# Patient Record
Sex: Female | Born: 1995 | Race: Black or African American | Hispanic: Yes | Marital: Single | Smoking: Never smoker
Health system: Southern US, Community
[De-identification: ages and names within clinical notes are randomized; demographics above are authoritative.]

---

## 2020-11-26 ENCOUNTER — Emergency Department (HOSPITAL_BASED_OUTPATIENT_CLINIC_OR_DEPARTMENT_OTHER)
Admission: EM | Admit: 2020-11-26 | Discharge: 2020-11-26 | Disposition: A | Discharge status: Home / Self Care | Payer: Self-pay | Attending: Emergency Medicine | Admitting: Emergency Medicine

## 2020-11-26 ENCOUNTER — Encounter (HOSPITAL_BASED_OUTPATIENT_CLINIC_OR_DEPARTMENT_OTHER): Payer: Self-pay

## 2020-11-26 ENCOUNTER — Emergency Department (HOSPITAL_BASED_OUTPATIENT_CLINIC_OR_DEPARTMENT_OTHER): Payer: Self-pay

## 2020-11-26 ENCOUNTER — Other Ambulatory Visit: Payer: Self-pay

## 2020-11-26 DIAGNOSIS — R102 Pelvic and perineal pain: Secondary | ICD-10-CM

## 2020-11-26 DIAGNOSIS — B9689 Other specified bacterial agents as the cause of diseases classified elsewhere: Secondary | ICD-10-CM | POA: Insufficient documentation

## 2020-11-26 DIAGNOSIS — N898 Other specified noninflammatory disorders of vagina: Secondary | ICD-10-CM

## 2020-11-26 DIAGNOSIS — N390 Urinary tract infection, site not specified: Secondary | ICD-10-CM | POA: Insufficient documentation

## 2020-11-26 DIAGNOSIS — N760 Acute vaginitis: Secondary | ICD-10-CM | POA: Insufficient documentation

## 2020-11-26 LAB — URINALYSIS, ROUTINE W REFLEX MICROSCOPIC
Glucose, UA: NEGATIVE mg/dL
Hgb urine dipstick: NEGATIVE
Ketones, ur: NEGATIVE mg/dL
Nitrite: POSITIVE — AB
Protein, ur: 30 mg/dL — AB
Specific Gravity, Urine: 1.033 — ABNORMAL HIGH (ref 1.005–1.030)
WBC, UA: >50 WBC/hpf — ABNORMAL HIGH (ref 0–5)
pH: 6.5 (ref 5.0–8.0)

## 2020-11-26 LAB — COMPREHENSIVE METABOLIC PANEL
ALT: 11 U/L (ref 0–44)
AST: 13 U/L — ABNORMAL LOW (ref 15–41)
Albumin: 4 g/dL (ref 3.5–5.0)
Alkaline Phosphatase: 41 U/L (ref 38–126)
Anion gap: 7 (ref 5–15)
BUN: 16 mg/dL (ref 6–20)
CO2: 26 mmol/L (ref 22–32)
Calcium: 9.2 mg/dL (ref 8.9–10.3)
Chloride: 104 mmol/L (ref 98–111)
Creatinine, Ser: 0.75 mg/dL (ref 0.44–1.00)
GFR, Estimated: >60 mL/min (ref >60)
Glucose, Bld: 82 mg/dL (ref 70–99)
Potassium: 4.1 mmol/L (ref 3.5–5.1)
Sodium: 137 mmol/L (ref 135–145)
Total Bilirubin: 0.2 mg/dL — ABNORMAL LOW (ref 0.3–1.2)
Total Protein: 7.7 g/dL (ref 6.5–8.1)

## 2020-11-26 LAB — CBC WITH DIFFERENTIAL/PLATELET
Abs Immature Granulocytes: 0.01 10*3/uL (ref 0.00–0.07)
Basophils Absolute: 0.1 10*3/uL (ref 0.0–0.1)
Basophils Relative: 1 %
Eosinophils Absolute: 0.1 10*3/uL (ref 0.0–0.5)
Eosinophils Relative: 1 %
HCT: 32.1 % — ABNORMAL LOW (ref 36.0–46.0)
Hemoglobin: 9.9 g/dL — ABNORMAL LOW (ref 12.0–15.0)
Immature Granulocytes: 0 %
Lymphocytes Relative: 34 %
Lymphs Abs: 2.1 10*3/uL (ref 0.7–4.0)
MCH: 25.9 pg — ABNORMAL LOW (ref 26.0–34.0)
MCHC: 30.8 g/dL (ref 30.0–36.0)
MCV: 84 fL (ref 80.0–100.0)
Monocytes Absolute: 0.7 10*3/uL (ref 0.1–1.0)
Monocytes Relative: 11 %
Neutro Abs: 3.2 10*3/uL (ref 1.7–7.7)
Neutrophils Relative %: 53 %
Platelets: 502 10*3/uL — ABNORMAL HIGH (ref 150–400)
RBC: 3.82 MIL/uL — ABNORMAL LOW (ref 3.87–5.11)
RDW: 14.8 % (ref 11.5–15.5)
WBC: 6.2 10*3/uL (ref 4.0–10.5)
nRBC: 0 % (ref 0.0–0.2)

## 2020-11-26 LAB — PREGNANCY, URINE: Preg Test, Ur: NEGATIVE

## 2020-11-26 LAB — WET PREP, GENITAL
Sperm: NONE SEEN
Trich, Wet Prep: NONE SEEN
Yeast Wet Prep HPF POC: NONE SEEN

## 2020-11-26 MED ORDER — DEXTROSE 5 % IV SOLN: 500.0000 mg | Freq: Once | INTRAVENOUS | Status: DC

## 2020-11-26 MED ORDER — DOXYCYCLINE HYCLATE 100 MG PO CAPS: 100.0000 mg | ORAL_CAPSULE | Freq: Two times a day (BID) | ORAL | 0 refills | Status: AC

## 2020-11-26 MED ORDER — CEPHALEXIN 500 MG PO CAPS: 500.0000 mg | ORAL_CAPSULE | Freq: Four times a day (QID) | ORAL | 0 refills | Status: AC

## 2020-11-26 MED ORDER — SODIUM CHLORIDE 0.9 % IV SOLN
1.0000 g | Freq: Once | INTRAVENOUS | Status: AC
  Administered 2020-11-26: 1 g via INTRAVENOUS
  Filled 2020-11-26: qty 10

## 2020-11-26 MED ORDER — METRONIDAZOLE 500 MG PO TABS: 500.0000 mg | ORAL_TABLET | Freq: Two times a day (BID) | ORAL | 0 refills | Status: DC

## 2020-11-26 NOTE — ED Triage Notes (Signed)
Onset 11/10/20 cramping and frequency.  States has continued pain bilateral in sides.  No nausea or vomiting

## 2020-11-26 NOTE — Discharge Instructions (Addendum)
You tested positive for bacterial vaginosis.  You will need to take metronidazole.  While taking this medicine, you should not consume any alcohol as this can cause severe side effects with metronidazole.  Your urinalysis also was concerning for a urinary tract infection.  Please take the cephalexin as prescribed for a UTI.  Your gonorrhea and Chlamydia testing will hopefully come back in 24 to 48 hours.  Please check MyChart for results.  If positive, both you and your partner will need to complete treatment and refrain from any sexual activity for at least 1 week after treatment is complete.  Recommend completing a course of doxycycline for treatment of possible STDs.  If you develop worsening abdominal pain, vomiting, fever, come back to ER for reassessment.

## 2020-11-26 NOTE — ED Provider Notes (Signed)
[facility] EMERGENCY DEPT Provider Note   CSN: [id number] Arrival date & time: 11/26/20  1519     History Chief Complaint  Patient presents with  . Urinary Frequency    Urinary frequency with bladder cranping.    Amanda Charles is a 25 y.o. female.  Presents to ER with concern for lower abdominal cramping.  States that she had pain starting around the time of her period a couple weeks ago.  Felt to be similar to her normal menstrual cycle pain.  However the pain persisted despite completing her menstrual cycle.  Normal amount of bleeding.  No ongoing vaginal bleeding.  Has noted new yellow discharge recently.  She is sexually active with 1 female partner, does use protection.  Unsure of any known STD exposures.  Pain is currently mild to moderate, crampy, across lower abdomen.  No alleviating or aggravating factors. HPI     History reviewed. No pertinent past medical history.  There are no problems to display for this patient.   History reviewed. No pertinent surgical history.   OB History   No obstetric history on file.     History reviewed. No pertinent family history.  Social History   Tobacco Use  . Smoking status: Never Smoker  . Smokeless tobacco: Never Used  Vaping Use  . Vaping Use: Never used  Substance Use Topics  . Alcohol use: Yes  . Drug use: Yes    Types: Marijuana    Home Medications Prior to Admission medications   Medication Sig Start Date End Date Taking? Authorizing Provider  cephALEXin (KEFLEX) 500 MG capsule Take 1 capsule (500 mg total) by mouth 4 (four) times daily for 5 days. 11/26/20 12/01/20 Yes Sanjuan Sawa, Quitman Livings, MD  doxycycline (VIBRAMYCIN) 100 MG capsule Take 1 capsule (100 mg total) by mouth 2 (two) times daily for 7 days. 11/26/20 12/03/20 Yes Hakan Nudelman, Quitman Livings, MD  metroNIDAZOLE (FLAGYL) 500 MG tablet Take 1 tablet (500 mg total) by mouth 2 (two) times daily. 11/26/20  Yes Milagros Loll, MD    Allergies    Patient has no  allergy information on record.  Review of Systems   Review of Systems  Constitutional: Negative for chills and fever.  HENT: Negative for ear pain and sore throat.   Eyes: Negative for pain and visual disturbance.  Respiratory: Negative for cough and shortness of breath.   Cardiovascular: Negative for chest pain and palpitations.  Gastrointestinal: Positive for abdominal pain. Negative for vomiting.  Genitourinary: Positive for pelvic pain. Negative for dysuria and hematuria.  Musculoskeletal: Negative for arthralgias and back pain.  Skin: Negative for color change and rash.  Neurological: Negative for seizures and syncope.  All other systems reviewed and are negative.   Physical Exam Updated Vital Signs BP 117/69   Pulse 68   Temp 98.6 F (37 C) (Oral)   Resp 18   Ht 5\' 2"  (1.575 m)   Wt 117.9 kg   LMP 11/13/2020   SpO2 98%   BMI 47.55 kg/m   Physical Exam Vitals and nursing note reviewed.  Constitutional:      General: She is not in acute distress.    Appearance: She is well-developed.  HENT:     Head: Normocephalic and atraumatic.  Eyes:     Conjunctiva/sclera: Conjunctivae normal.  Cardiovascular:     Rate and Rhythm: Normal rate and regular rhythm.     Heart sounds: No murmur heard.   Pulmonary:     Effort: Pulmonary effort  is normal. No respiratory distress.     Breath sounds: Normal breath sounds.  Abdominal:     Palpations: Abdomen is soft.     Tenderness: There is no abdominal tenderness.  Genitourinary:    Comments: Small yellow discharge noted in vagina, normal-appearing cervix, no CMT, no adnexal tenderness or mass appreciated, tech at bedside for chaperone Musculoskeletal:     Cervical back: Neck supple.  Skin:    General: Skin is warm and dry.  Neurological:     Mental Status: She is alert.     ED Results / Procedures / Treatments   Labs (all labs ordered are listed, but only abnormal results are displayed) Labs Reviewed  WET PREP,  GENITAL - Abnormal; Notable for the following components:      Result Value   Clue Cells Wet Prep HPF POC PRESENT (*)    WBC, Wet Prep HPF POC MANY (*)    All other components within normal limits  URINALYSIS, ROUTINE W REFLEX MICROSCOPIC - Abnormal; Notable for the following components:   Color, Urine ORANGE (*)    APPearance HAZY (*)    Specific Gravity, Urine 1.033 (*)    Bilirubin Urine MODERATE (*)    Protein, ur 30 (*)    Nitrite POSITIVE (*)    Leukocytes,Ua MODERATE (*)    WBC, UA >50 (*)    All other components within normal limits  CBC WITH DIFFERENTIAL/PLATELET - Abnormal; Notable for the following components:   RBC 3.82 (*)    Hemoglobin 9.9 (*)    HCT 32.1 (*)    MCH 25.9 (*)    Platelets 502 (*)    All other components within normal limits  COMPREHENSIVE METABOLIC PANEL - Abnormal; Notable for the following components:   AST 13 (*)    Total Bilirubin 0.2 (*)    All other components within normal limits  PREGNANCY, URINE  GC/CHLAMYDIA PROBE AMP ([place]) NOT AT [facility]  GC/CHLAMYDIA PROBE AMP ([place]) NOT AT [facility]    EKG None  Radiology US PELVIC COMPLETE W TRANSVAGINAL AND TORSION R/O  Result Date: 11/26/2020 CLINICAL DATA:  Bilateral pelvic cramping. EXAM: TRANSABDOMINAL AND TRANSVAGINAL ULTRASOUND OF PELVIS DOPPLER ULTRASOUND OF OVARIES TECHNIQUE: Both transabdominal and transvaginal ultrasound examinations of the pelvis were performed. Transabdominal technique was performed for global imaging of the pelvis including uterus, ovaries, adnexal regions, and pelvic cul-de-sac. It was necessary to proceed with endovaginal exam following the transabdominal exam to visualize the adnexa. Color and duplex Doppler ultrasound was utilized to evaluate blood flow to the ovaries. COMPARISON:  None. FINDINGS: Uterus Measurements: 7.0 x 3.0 x 4.3 cm = volume: 48 mL. No fibroids or other mass visualized. Endometrium Thickness: 7 mm.  No focal abnormality visualized. Right  ovary Measurements: 4.0 x 1.9 x 3.1 cm = volume: 11.8 mL. Normal appearance/no adnexal mass. Left ovary Measurements: 3.3 x 2.8 x 2.8 cm = volume: 13.6 mL. Normal appearance/no adnexal mass. Pulsed Doppler evaluation of both ovaries demonstrates normal low-resistance arterial and venous waveforms. Simple cyst on the left ovary measures 2.4 cm, likely physiologic. Other findings Small to moderate amount of free fluid in the pelvis. IMPRESSION: Normal pelvic ultrasound. Electronically Signed   By: Ted Mcalpine M.D.   On: 11/26/2020 16:59    Procedures Procedures   Medications Ordered in ED Medications  cefTRIAXone (ROCEPHIN) 1 g in sodium chloride 0.9 % 100 mL IVPB (1 g Intravenous New Bag/Given 11/26/20 1844)    ED Course  I have reviewed  the triage vital signs and the nursing notes.  Pertinent labs & imaging results that were available during my care of the patient were reviewed by me and considered in my medical decision making (see chart for details).    MDM Rules/Calculators/A&P                         25 year old lady presented to ER with concern for lower abdominal pain/pelvic cramping for the past few weeks.  On exam she was well-appearing, mild tenderness in the suprapubic region but otherwise soft and benign abdominal exam.  GU exam notable for yellow discharge.  UA consistent with UTI.  Also BV positive.  Basic labs are stable.  No leukocytosis.  No focal tenderness at McBurney's point, doubt acute abdominal process given benign exam and reassuring labs and chronicity of pain.  TVUS negative for torsion or TOA.  Given h color of discharge and sexually active, will treat presumptively for possible GC and chlamydia.  On reassessment, remains well-appearing with soft abdomen.  Will be discharged home with Rx for Keflex for UTI, Flagyl for BV and Doxy for chlamydia.  Received Rocephin in ER.  Reviewed return precautions and recommended follow-up with primary doctor.  After the  discussed management above, the patient was determined to be safe for discharge.  The patient was in agreement with this plan and all questions regarding their care were answered.  ED return precautions were discussed and the patient will return to the ED with any significant worsening of condition.    Final Clinical Impression(s) / ED Diagnoses Final diagnoses:  Urinary tract infection without hematuria, site unspecified  Bacterial vaginosis  Vaginal discharge    Rx / DC Orders ED Discharge Orders         Ordered    metroNIDAZOLE (FLAGYL) 500 MG tablet  2 times daily        11/26/20 1805    doxycycline (VIBRAMYCIN) 100 MG capsule  2 times daily        11/26/20 1805    cephALEXin (KEFLEX) 500 MG capsule  4 times daily        11/26/20 1805           Milagros Loll, MD 11/27/20 1640

## 2020-11-26 NOTE — ED Notes (Signed)
Patient transported to Ultrasound 

## 2020-11-28 LAB — GC/CHLAMYDIA PROBE AMP (~~LOC~~) NOT AT ARMC
Chlamydia: POSITIVE — AB
Comment: NEGATIVE
Comment: NORMAL
Neisseria Gonorrhea: NEGATIVE

## 2020-12-17 ENCOUNTER — Encounter (HOSPITAL_BASED_OUTPATIENT_CLINIC_OR_DEPARTMENT_OTHER): Payer: Self-pay

## 2020-12-17 ENCOUNTER — Other Ambulatory Visit: Payer: Self-pay

## 2020-12-17 ENCOUNTER — Emergency Department (HOSPITAL_BASED_OUTPATIENT_CLINIC_OR_DEPARTMENT_OTHER)
Admission: EM | Admit: 2020-12-17 | Discharge: 2020-12-18 | Disposition: A | Discharge status: Home / Self Care | Payer: Medicaid Other | Attending: Emergency Medicine | Admitting: Emergency Medicine

## 2020-12-17 DIAGNOSIS — N898 Other specified noninflammatory disorders of vagina: Secondary | ICD-10-CM | POA: Insufficient documentation

## 2020-12-17 NOTE — ED Triage Notes (Signed)
Pt was here  3 wks ago  and was dx with chlamydia - states she finished her abx 12/04/2020 but still she noticed vaginal discharge  Denies abd pain /  fever

## 2020-12-17 NOTE — ED Provider Notes (Signed)
[facility] EMERGENCY DEPT Provider Note   CSN: [id number] Arrival date & time: 12/17/20  2054     History No chief complaint on file.   Amanda Charles is a 25 y.o. female.  HPI    This is a 25 year old female who presents with concerns for vaginal discharge.  Patient reports that she was seen and evaluated 3 weeks ago for the same.  At that time she was diagnosed with urinary tract infection and ultimately was chlamydia positive.  She was also treated for BV.  She states she took a full course of antibiotics.  Per chart review it appears that she was sent home on Keflex, doxycycline, and Flagyl.  She has not had any sexual encounters since that visit.  She states that she just had a menstrual period and after that ended, she noted that she is continued to have some vaginal discharge.  No abdominal pain, no vaginal itching.  Denies dysuria. History reviewed. No pertinent past medical history.  There are no problems to display for this patient.   History reviewed. No pertinent surgical history.   OB History   No obstetric history on file.     History reviewed. No pertinent family history.  Social History   Tobacco Use   Smoking status: Never   Smokeless tobacco: Never  Vaping Use   Vaping Use: Never used  Substance Use Topics   Alcohol use: Yes   Drug use: Yes    Types: Marijuana    Home Medications Prior to Admission medications   Medication Sig Start Date End Date Taking? Authorizing Provider  metroNIDAZOLE (FLAGYL) 500 MG tablet Take 1 tablet (500 mg total) by mouth 2 (two) times daily. 11/26/20   Milagros Loll, MD    Allergies    Patient has no allergy information on record.  Review of Systems   Review of Systems  Constitutional:  Negative for fever.  Gastrointestinal:  Negative for abdominal pain.  Genitourinary:  Positive for vaginal discharge. Negative for difficulty urinating, dysuria and frequency.  All other systems reviewed and are  negative.  Physical Exam Updated Vital Signs BP (!) 147/91   Pulse 77   Temp 98.2 F (36.8 C) (Oral)   Resp 18   Ht 1.575 m (5\' 2" )   Wt 117 kg   LMP 12/12/2020   SpO2 100%   BMI 47.18 kg/m   Physical Exam Vitals and nursing note reviewed.  Constitutional:      Appearance: She is well-developed. She is obese.  HENT:     Head: Normocephalic and atraumatic.     Mouth/Throat:     Mouth: Mucous membranes are moist.  Eyes:     Pupils: Pupils are equal, round, and reactive to light.  Cardiovascular:     Rate and Rhythm: Normal rate and regular rhythm.  Pulmonary:     Effort: Pulmonary effort is normal. No respiratory distress.  Abdominal:     Palpations: Abdomen is soft.     Tenderness: There is no abdominal tenderness.  Genitourinary:    Comments: pelvic exam deferred, patient self swabbed Musculoskeletal:     Cervical back: Neck supple.  Skin:    General: Skin is warm and dry.  Neurological:     Mental Status: She is alert and oriented to person, place, and time.  Psychiatric:        Mood and Affect: Mood normal.    ED Results / Procedures / Treatments   Labs (all labs ordered are listed, but  only abnormal results are displayed) Labs Reviewed  URINALYSIS, ROUTINE W REFLEX MICROSCOPIC - Abnormal; Notable for the following components:      Result Value   Specific Gravity, Urine 1.036 (*)    Ketones, ur TRACE (*)    Protein, ur 30 (*)    All other components within normal limits  WET PREP, GENITAL  PREGNANCY, URINE  GC/CHLAMYDIA PROBE AMP ([place]) NOT AT [facility]    EKG None  Radiology No results found.  Procedures Procedures   Medications Ordered in ED Medications - No data to display  ED Course  I have reviewed the triage vital signs and the nursing notes.  Pertinent labs & imaging results that were available during my care of the patient were reviewed by me and considered in my medical decision making (see chart for details).    MDM  Rules/Calculators/A&P                          Patient presents with ongoing vaginal discharge.  Nontoxic and vital signs notable for blood pressure of 147/91.  Denies any sexual contact since testing positive for chlamydia.  She reports full course of treatment.  Patient deferred pelvic exam today she is not having any abdominal pain.  She self swabbed.  Wet prep does not show any evidence of clue cells.  Urinalysis appears clean and no residual UTI.  Repeat GC testing pending.  Discussed with patient that her initial work-up is fairly reassuring.  She will receive a phone call if she test positive again and will have appropriate antibiotics called in.  I discussed with patient that some vaginal discharge can be normal as well and will fluctuate during her menstrual cycle.  Patient stated understanding.  After history, exam, and medical workup I feel the patient has been appropriately medically screened and is safe for discharge home. Pertinent diagnoses were discussed with the patient. Patient was given return precautions.    Final Clinical Impression(s) / ED Diagnoses Final diagnoses:  Vaginal discharge    Rx / DC Orders ED Discharge Orders     None        Ramir Malerba, Mayer Masker, MD 12/18/20 [phone]

## 2020-12-18 LAB — URINALYSIS, ROUTINE W REFLEX MICROSCOPIC
Bilirubin Urine: NEGATIVE
Glucose, UA: NEGATIVE mg/dL
Hgb urine dipstick: NEGATIVE
Leukocytes,Ua: NEGATIVE
Nitrite: NEGATIVE
Protein, ur: 30 mg/dL — AB
Specific Gravity, Urine: 1.036 — ABNORMAL HIGH (ref 1.005–1.030)
pH: 7 (ref 5.0–8.0)

## 2020-12-18 LAB — PREGNANCY, URINE: Preg Test, Ur: NEGATIVE

## 2020-12-18 LAB — WET PREP, GENITAL
Clue Cells Wet Prep HPF POC: NONE SEEN
Sperm: NONE SEEN
Trich, Wet Prep: NONE SEEN
WBC, Wet Prep HPF POC: NONE SEEN
Yeast Wet Prep HPF POC: NONE SEEN

## 2020-12-18 NOTE — ED Notes (Signed)
Assisted patient with wet prep swabs.

## 2020-12-18 NOTE — Discharge Instructions (Addendum)
Your repeat testing shows no evidence of UTI or BV.  Repeat chlamydia testing is pending.  Abstain from sexual activity until your repeat testing returns.

## 2020-12-19 LAB — GC/CHLAMYDIA PROBE AMP (~~LOC~~) NOT AT ARMC
Chlamydia: NEGATIVE
Comment: NEGATIVE
Comment: NORMAL
Neisseria Gonorrhea: NEGATIVE

## 2020-12-21 ENCOUNTER — Ambulatory Visit (HOSPITAL_BASED_OUTPATIENT_CLINIC_OR_DEPARTMENT_OTHER): Payer: Medicaid Other | Admitting: Nurse Practitioner

## 2020-12-21 ENCOUNTER — Ambulatory Visit (HOSPITAL_BASED_OUTPATIENT_CLINIC_OR_DEPARTMENT_OTHER): Payer: Medicaid Other | Admitting: Family Medicine

## 2021-06-01 ENCOUNTER — Other Ambulatory Visit: Payer: Self-pay

## 2021-06-01 ENCOUNTER — Encounter (HOSPITAL_BASED_OUTPATIENT_CLINIC_OR_DEPARTMENT_OTHER): Payer: Self-pay | Admitting: Emergency Medicine

## 2021-06-01 DIAGNOSIS — R102 Pelvic and perineal pain: Secondary | ICD-10-CM | POA: Insufficient documentation

## 2021-06-01 DIAGNOSIS — N898 Other specified noninflammatory disorders of vagina: Secondary | ICD-10-CM | POA: Insufficient documentation

## 2021-06-01 LAB — PREGNANCY, URINE: Preg Test, Ur: NEGATIVE

## 2021-06-01 LAB — URINALYSIS, ROUTINE W REFLEX MICROSCOPIC
Glucose, UA: NEGATIVE mg/dL
Hgb urine dipstick: NEGATIVE
Ketones, ur: NEGATIVE mg/dL
Leukocytes,Ua: NEGATIVE
Nitrite: POSITIVE — AB
Protein, ur: NEGATIVE mg/dL
Specific Gravity, Urine: 1.025 (ref 1.005–1.030)
pH: 6.5 (ref 5.0–8.0)

## 2021-06-01 NOTE — ED Triage Notes (Signed)
Pt presents to ED POV. Pt c/o vaginal discharge and pelvic pain. Pt reports that she was treated for chlamydia, BV, and UTI in July. Pt reports that she never really felt the same after that but s/s are worsening again.

## 2021-06-02 ENCOUNTER — Emergency Department (HOSPITAL_BASED_OUTPATIENT_CLINIC_OR_DEPARTMENT_OTHER): Payer: Medicaid Other

## 2021-06-02 ENCOUNTER — Emergency Department (HOSPITAL_BASED_OUTPATIENT_CLINIC_OR_DEPARTMENT_OTHER)
Admission: EM | Admit: 2021-06-02 | Discharge: 2021-06-02 | Disposition: A | Discharge status: Home / Self Care | Payer: Medicaid Other | Attending: Emergency Medicine | Admitting: Emergency Medicine

## 2021-06-02 DIAGNOSIS — R102 Pelvic and perineal pain: Secondary | ICD-10-CM

## 2021-06-02 DIAGNOSIS — N898 Other specified noninflammatory disorders of vagina: Secondary | ICD-10-CM

## 2021-06-02 LAB — WET PREP, GENITAL
Clue Cells Wet Prep HPF POC: NONE SEEN
Sperm: NONE SEEN
Trich, Wet Prep: NONE SEEN
WBC, Wet Prep HPF POC: <10 (ref <10)
Yeast Wet Prep HPF POC: NONE SEEN

## 2021-06-02 NOTE — ED Provider Notes (Signed)
[facility] EMERGENCY Provider Note: Lowella Dell, MD, FACEP  CSN: [id number] MRN: [id number] ARRIVAL: 06/01/21 at 2006 ROOM: DB016/DB016   CHIEF COMPLAINT  Vaginal Discharge   HISTORY OF PRESENT ILLNESS  06/02/21 1:34 AM Amanda Charles is a 25 y.o. female with 2 to 3 days of pelvic pain.  The pelvic pain is located in the midline suprapubic region.  It is dull but has a sharp component with palpation.  She rates it as a 5 out of 10, worse with palpation, urination or movement.  She has had an associated white vaginal discharge.  She has a history of being treated for chlamydia, BV and UTI in July of this year.   History reviewed. No pertinent past medical history.  History reviewed. No pertinent surgical history.  History reviewed. No pertinent family history.  Social History   Tobacco Use   Smoking status: Never   Smokeless tobacco: Never  Vaping Use   Vaping Use: Never used  Substance Use Topics   Alcohol use: Yes   Drug use: Yes    Types: Marijuana    Prior to Admission medications   Not on File    Allergies Patient has no known allergies.   REVIEW OF SYSTEMS  Negative except as noted here or in the History of Present Illness.   PHYSICAL EXAMINATION  Initial Vital Signs Blood pressure (!) 139/98, pulse 90, temperature 98.2 F (36.8 C), resp. rate 18, height 5\' 2"  (1.575 m), weight 99.8 kg, last menstrual period 05/14/2021, SpO2 100 %.  Examination General: Well-developed, well-nourished female in no acute distress; appearance consistent with age of record HENT: normocephalic; atraumatic Eyes: Normal appearance Neck: supple Heart: regular rate and rhythm Lungs: clear to auscultation bilaterally Abdomen: soft; nondistended; suprapubic tenderness; bowel sounds present GU: Normal external genitalia; slightly grayish-white vaginal discharge; no vaginal bleeding; no cervical motion tenderness; minimal adnexal tenderness Extremities: No deformity; full range of  motion; pulses normal Neurologic: Awake, alert and oriented; motor function intact in all extremities and symmetric; no facial droop Skin: Warm and dry Psychiatric: Normal mood and affect   RESULTS  Summary of this visit's results, reviewed and interpreted by myself:   EKG Interpretation  Date/Time:    Ventricular Rate:    PR Interval:    QRS Duration:   QT Interval:    QTC Calculation:   R Axis:     Text Interpretation:         Laboratory Studies: Results for orders placed or performed during the hospital encounter of 06/02/21 (from the past 24 hour(s))  Pregnancy, urine     Status: None   Collection Time: 06/01/21  9:31 PM  Result Value Ref Range   Preg Test, Ur NEGATIVE NEGATIVE  Urinalysis, Routine w reflex microscopic     Status: Abnormal   Collection Time: 06/01/21  9:31 PM  Result Value Ref Range   Color, Urine YELLOW YELLOW   APPearance CLEAR CLEAR   Specific Gravity, Urine 1.025 1.005 - 1.030   pH 6.5 5.0 - 8.0   Glucose, UA NEGATIVE NEGATIVE mg/dL   Hgb urine dipstick NEGATIVE NEGATIVE   Bilirubin Urine MODERATE (A) NEGATIVE   Ketones, ur NEGATIVE NEGATIVE mg/dL   Protein, ur NEGATIVE NEGATIVE mg/dL   Nitrite POSITIVE (A) NEGATIVE   Leukocytes,Ua NEGATIVE NEGATIVE   RBC / HPF 11-20 0 - 5 RBC/hpf   WBC, UA 0-5 0 - 5 WBC/hpf   Squamous Epithelial / LPF 0-5 0 - 5   Mucus PRESENT  Wet prep, genital     Status: None   Collection Time: 06/02/21  1:45 AM   Specimen: Cervical / Endocervical swab  Result Value Ref Range   Yeast Wet Prep HPF POC NONE SEEN NONE SEEN   Trich, Wet Prep NONE SEEN NONE SEEN   Clue Cells Wet Prep HPF POC NONE SEEN NONE SEEN   WBC, Wet Prep HPF POC <10 <10   Sperm NONE SEEN    Imaging Studies: US PELVIC COMPLETE WITH TRANSVAGINAL  Result Date: 06/02/2021 CLINICAL DATA:  Pelvic pain for 1 week EXAM: TRANSABDOMINAL AND TRANSVAGINAL ULTRASOUND OF PELVIS TECHNIQUE: Both transabdominal and transvaginal ultrasound examinations of the  pelvis were performed. Transabdominal technique was performed for global imaging of the pelvis including uterus, ovaries, adnexal regions, and pelvic cul-de-sac. It was necessary to proceed with endovaginal exam following the transabdominal exam to visualize the ovaries. COMPARISON:  11/26/2020 FINDINGS: Uterus Measurements: 7.1 x 3.0 x 4.1 cm. = volume: 45 mL. No fibroids or other mass visualized. Endometrium Thickness: 13 mm.  No focal abnormality visualized. Right ovary Measurements: 4.2 x 2.3 x 2.3 cm. = volume: 1.4 mL. Normal appearance/no adnexal mass. Left ovary Measurements: 3.7 x 2.6 x 2.0 cm. = volume: 10 mL. Normal appearance/no adnexal mass. Other findings Mild to moderate free fluid is noted stable in appearance from the prior exam likely physiologic in nature IMPRESSION: Free fluid is noted within the pelvis but stable from the prior exam and likely physiologic in nature. No acute abnormality is noted. Electronically Signed   By: Alcide Clever M.D.   On: 06/02/2021 02:29    ED COURSE and MDM  Nursing notes, initial and subsequent vitals signs, including pulse oximetry, reviewed and interpreted by myself.  Vitals:   06/01/21 2126 06/01/21 2126 06/02/21 0047 06/02/21 0145  BP:  (!) 149/96 (!) 139/98 114/82  Pulse:  70 90 76  Resp:  18 18 18   Temp:  98.2 F (36.8 C)    SpO2:  100% 100% 100%  Weight: 99.8 kg     Height: 5\' 2"  (1.575 m)      Medications - No data to display  The cause of the patient's vaginal discharge is unclear.  She is negative for trichomoniasis and bacterial vaginosis and the appearance was not consistent with a yeast infection.  Her pelvic exam reveals minimal adnexal tenderness and no cervical motion tenderness so I do not believe this represents cervicitis or PID at this time.  Her gonorrhea and Chlamydia are pending.  PROCEDURES  Procedures   ED DIAGNOSES     ICD-10-CM   1. Pelvic pain  R10.2 PELVIC COMPLETE WITH TRANSVAGINAL    PELVIC COMPLETE WITH  TRANSVAGINAL    2. Vaginal discharge  N89.8          [place], MD 06/02/21 [phone]

## 2021-06-04 LAB — GC/CHLAMYDIA PROBE AMP (~~LOC~~) NOT AT ARMC
Chlamydia: NEGATIVE
Comment: NEGATIVE
Comment: NORMAL
Neisseria Gonorrhea: NEGATIVE

## 2021-12-02 IMAGING — US US PELVIS COMPLETE TRANSABD/TRANSVAG W DUPLEX
1 series · 13 of 25 positions shown · non-contrast
Comparison: None.

CLINICAL DATA: Bilateral pelvic cramping.

EXAM:
TRANSABDOMINAL AND TRANSVAGINAL ULTRASOUND OF PELVIS
DOPPLER ULTRASOUND OF OVARIES
TECHNIQUE: Both transabdominal and transvaginal ultrasound examinations of the
pelvis were performed. Transabdominal technique was performed for
global imaging of the pelvis including uterus, ovaries, adnexal
regions, and pelvic cul-de-sac.
It was necessary to proceed with endovaginal exam following the
transabdominal exam to visualize the adnexa. Color and duplex
Doppler ultrasound was utilized to evaluate blood flow to the
ovaries.

[Series 1: us pelvic complete w transvaginal and torsion righ · 13 of 114 slices shown]
[im 1/114]
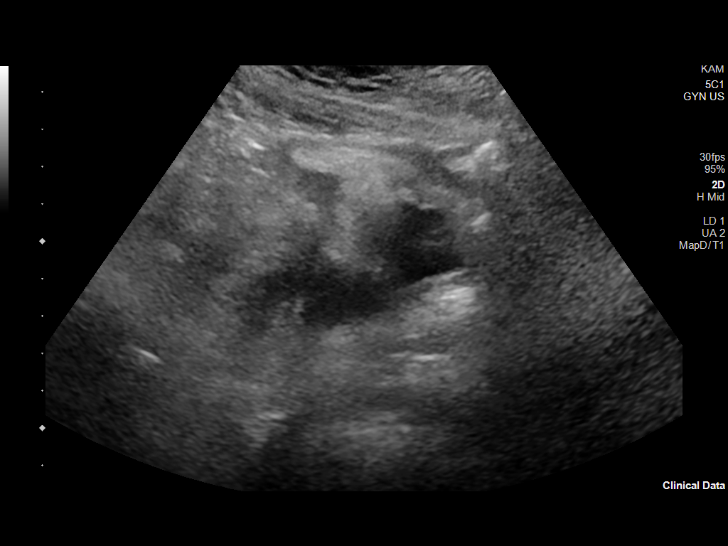
[im 10/114]
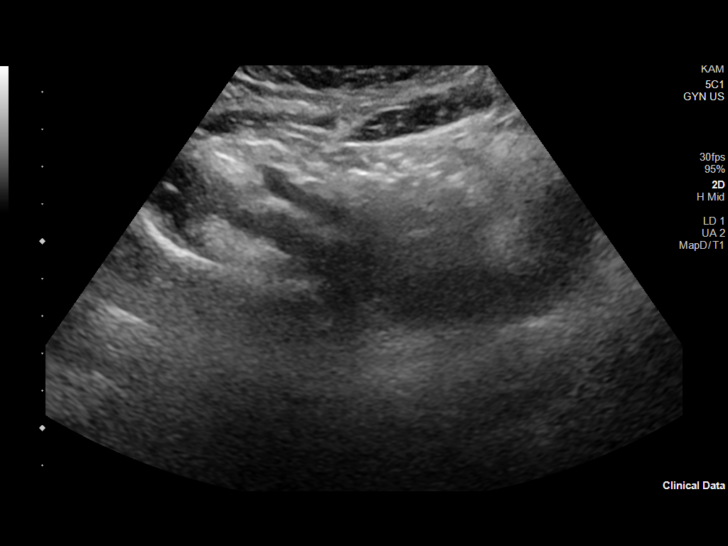
[im 19/114]
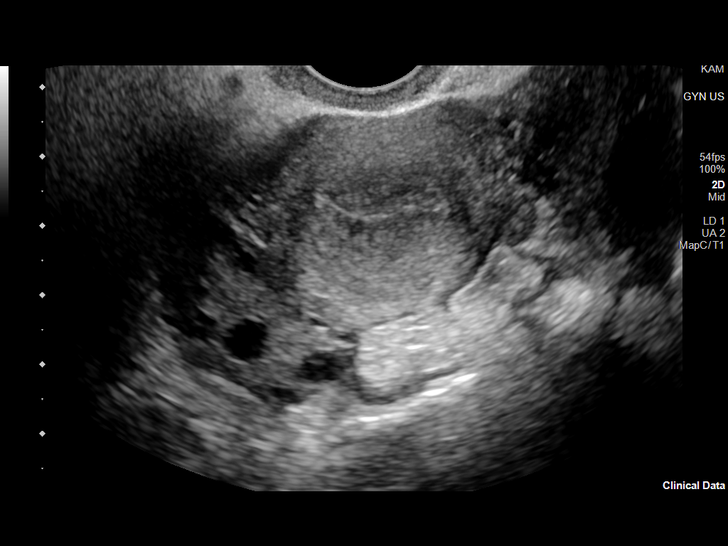
[im 29/114]
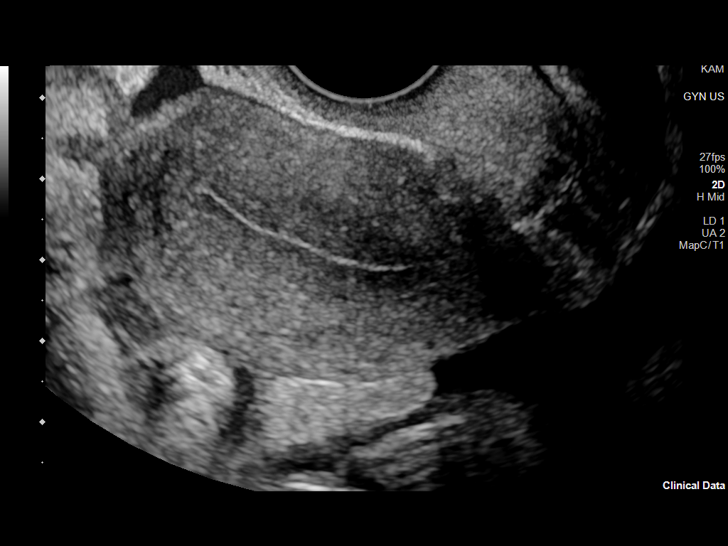
[im 38/114]
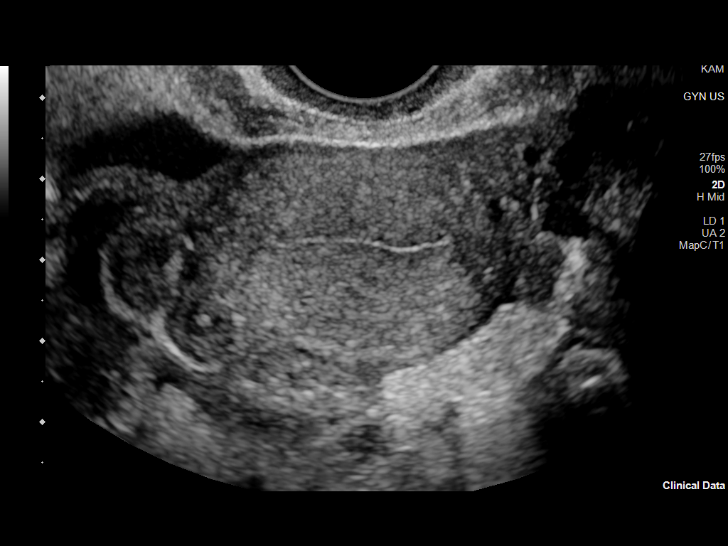
[im 48/114]
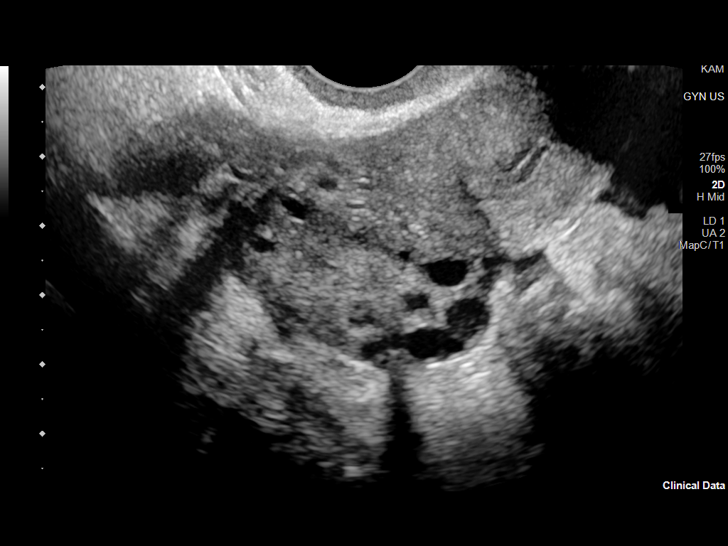
[im 57/114]
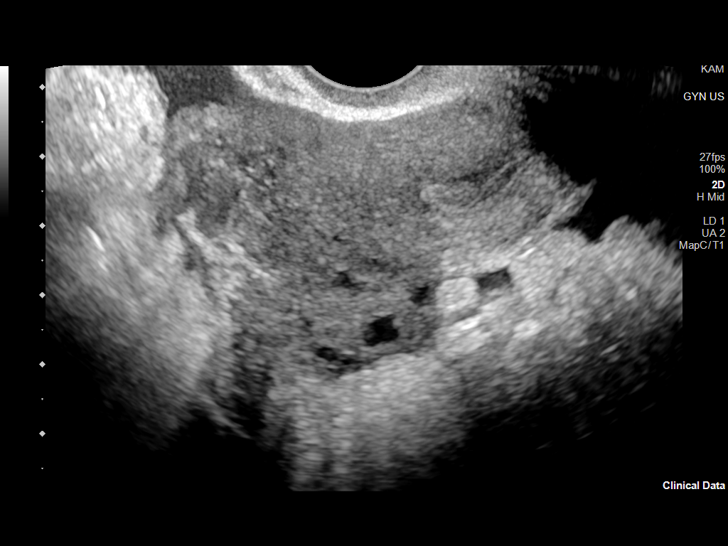
[im 66/114]
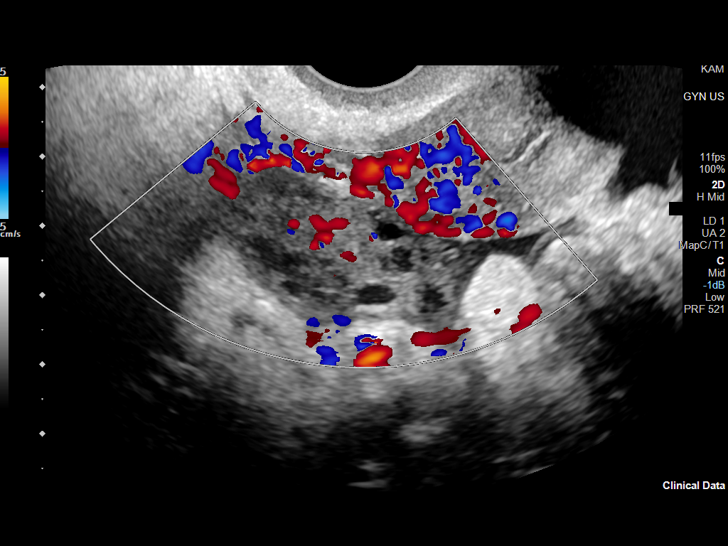
[im 76/114]
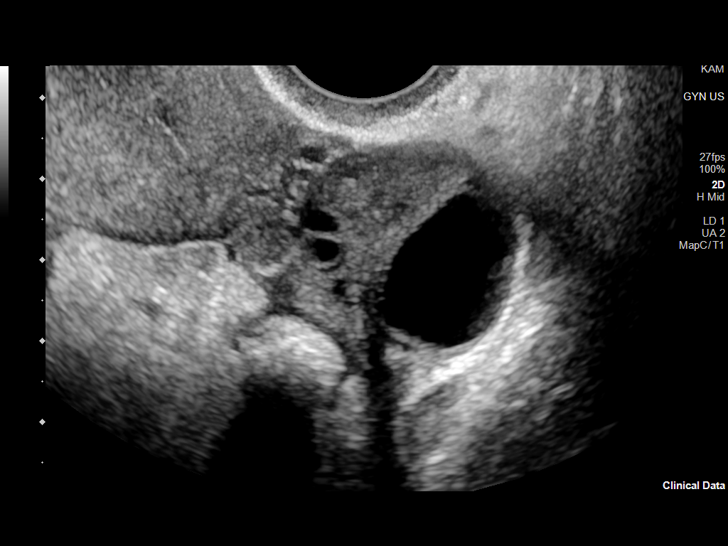
[im 85/114]
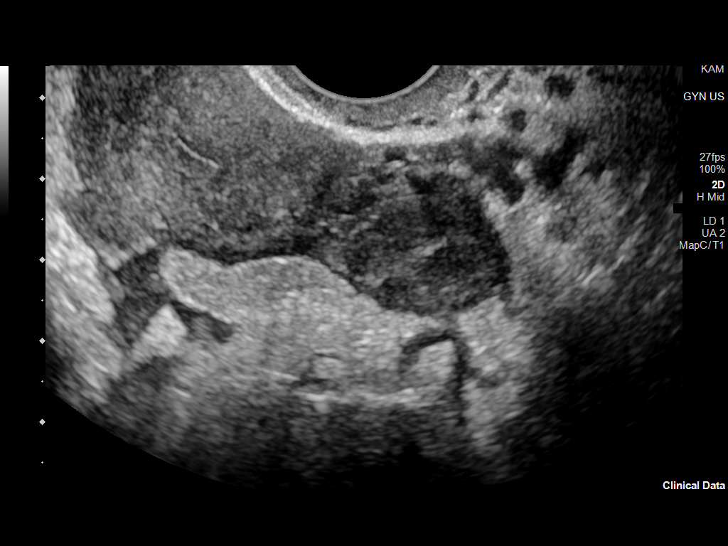
[im 95/114]
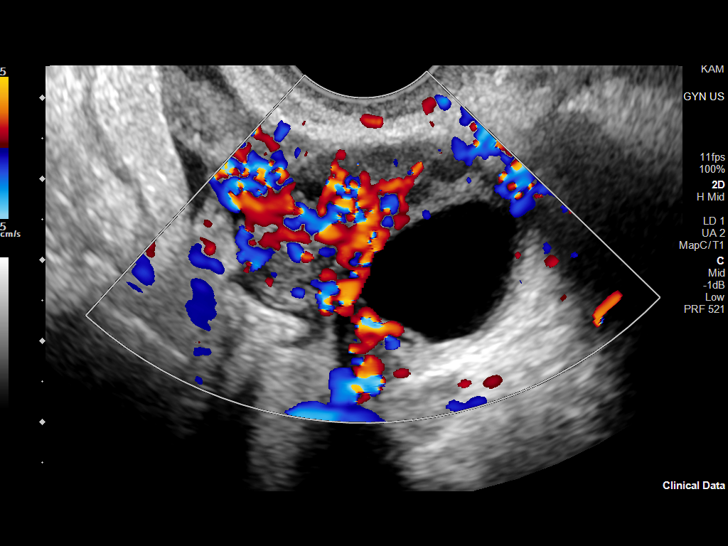
[im 104/114]
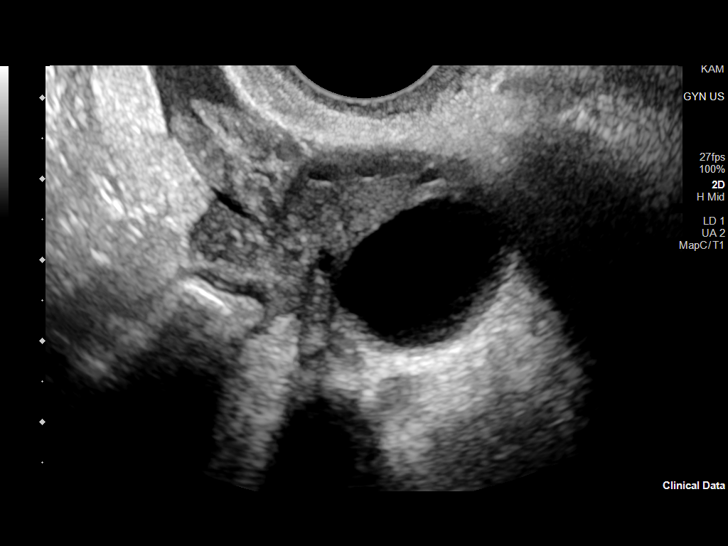
[im 114/114]
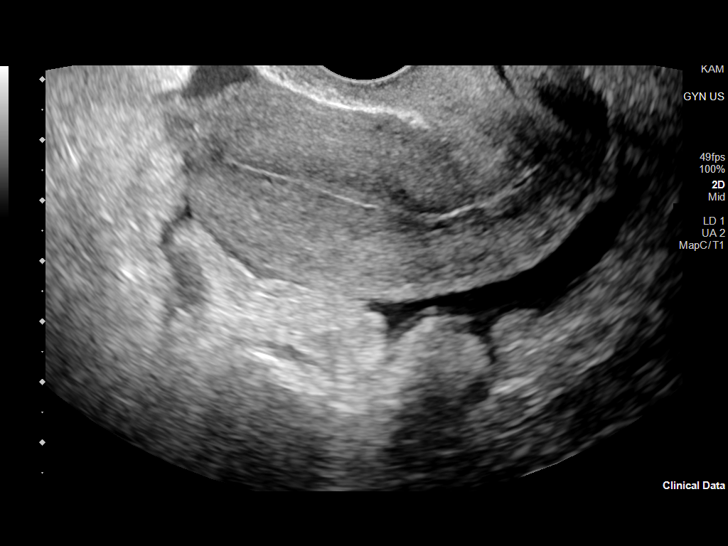

[13 of 25 positions shown; findings below may reference images not displayed]

FINDINGS: Uterus

Measurements: 7.0 x 3.0 x 4.3 cm = volume: 48 mL. No fibroids or
other mass visualized.

Endometrium

Thickness: 7 mm.  No focal abnormality visualized.

Right ovary

Measurements: 4.0 x 1.9 x 3.1 cm = volume: 11.8 mL. Normal
appearance/no adnexal mass.

Left ovary

Measurements: 3.3 x 2.8 x 2.8 cm = volume: 13.6 mL. Normal
appearance/no adnexal mass.

Pulsed Doppler evaluation of both ovaries demonstrates normal
low-resistance arterial and venous waveforms. Simple cyst on the
left ovary measures 2.4 cm, likely physiologic.

Other findings

Small to moderate amount of free fluid in the pelvis.
IMPRESSION: Normal pelvic ultrasound.

## 2022-06-08 IMAGING — US US PELVIS COMPLETE WITH TRANSVAGINAL
1 series · 14 of 25 positions shown · non-contrast
Comparison: 11/26/2020

CLINICAL DATA: Pelvic pain for 1 week



[Series 1: us pelvic complete with transvaginal · 14 of 103 slices shown]
[im 1/103]
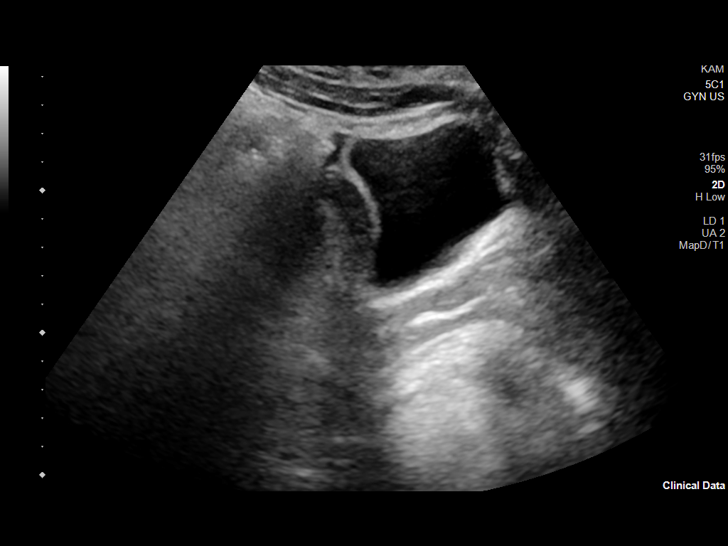
[im 9/103]
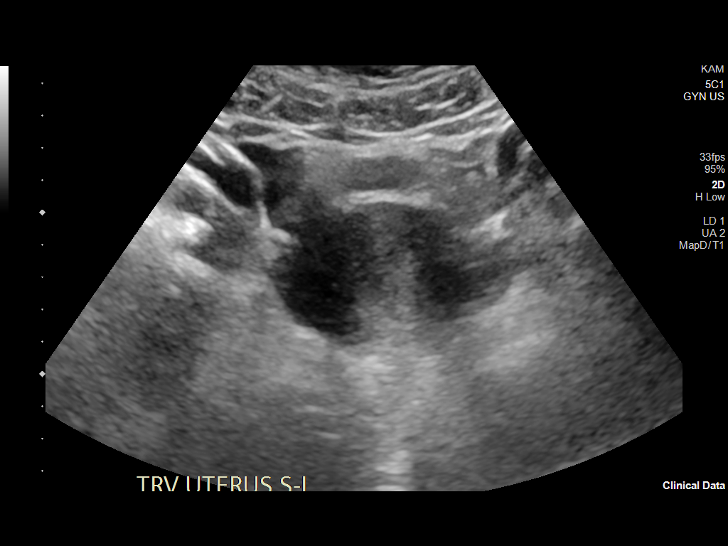
[im 18/103]
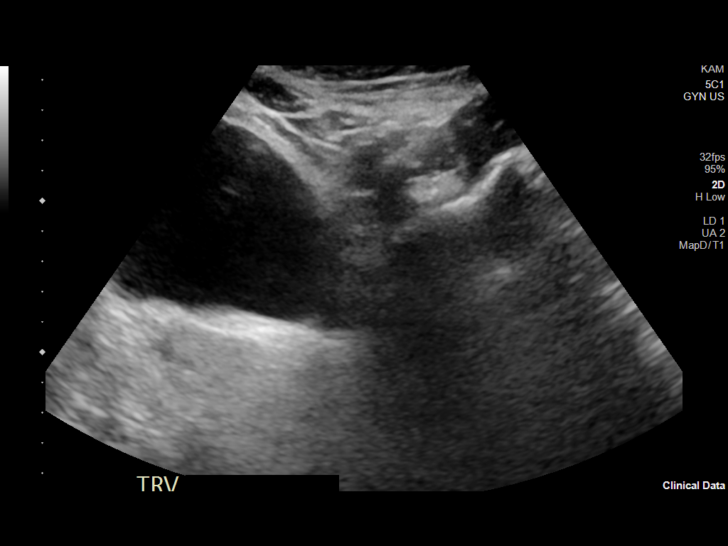
[im 26/103]
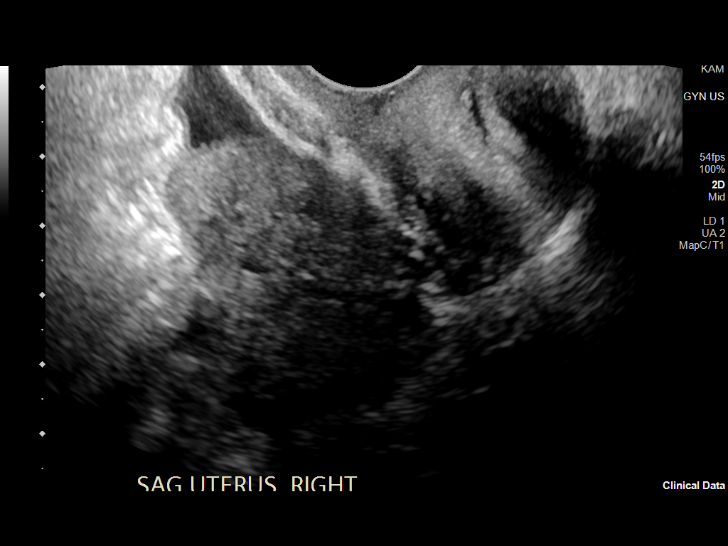
[im 35/103]
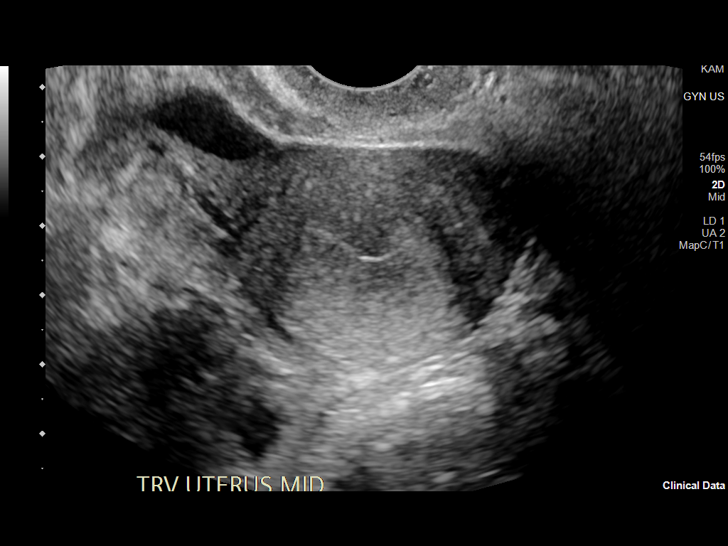
[im 39/103]
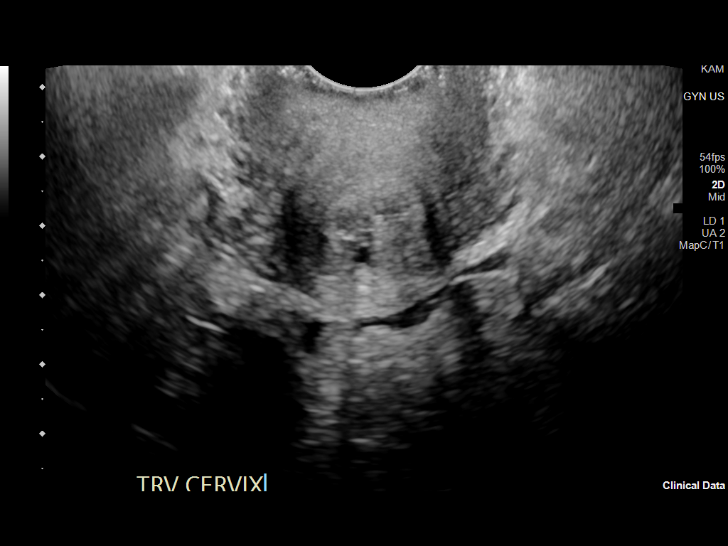
[im 47/103]
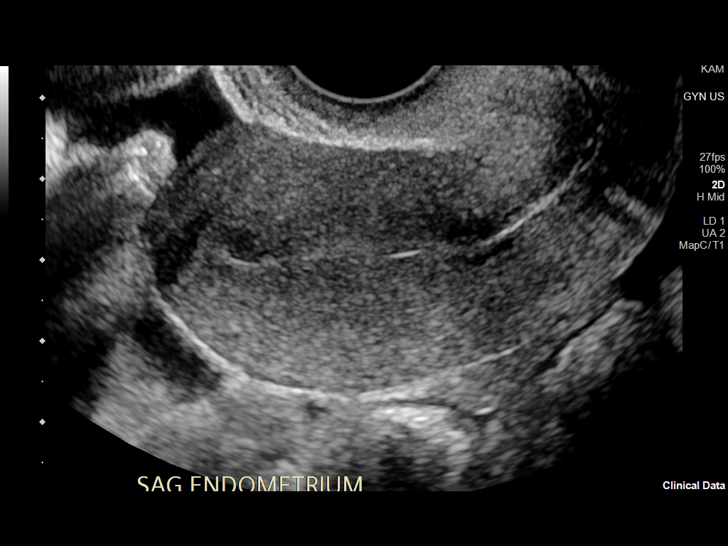
[im 56/103]
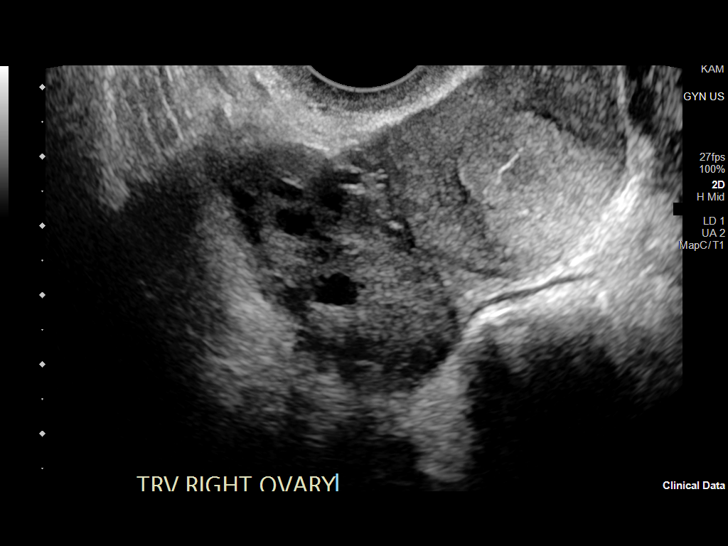
[im 64/103]
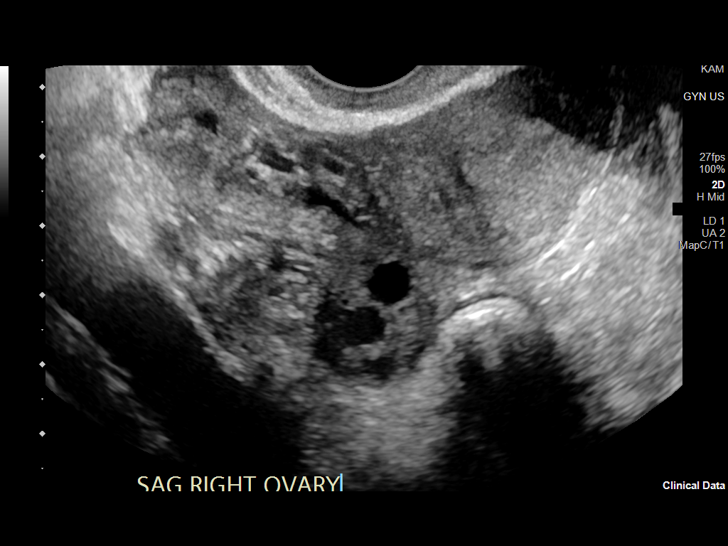
[im 69/103]
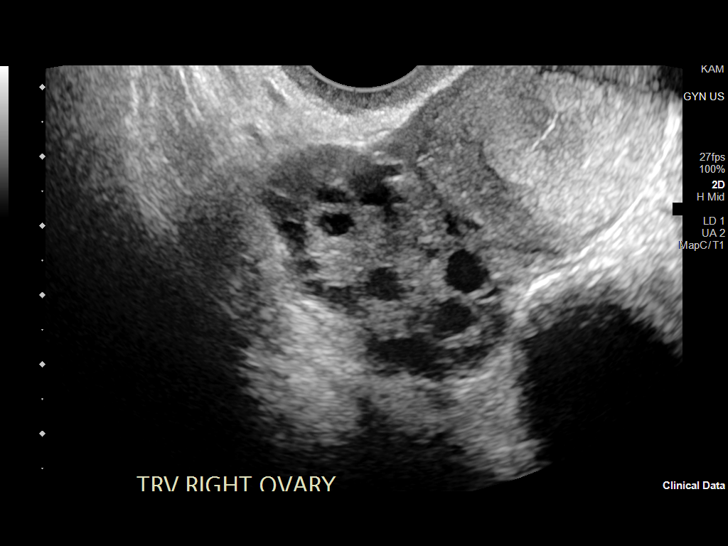
[im 77/103]
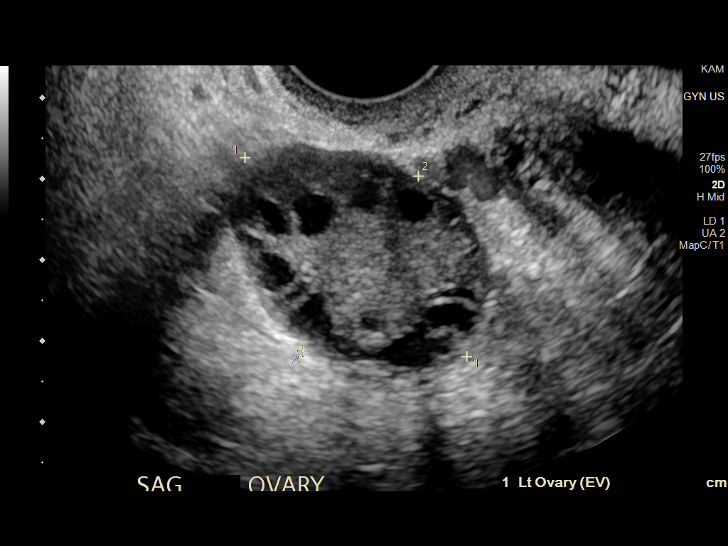
[im 86/103]
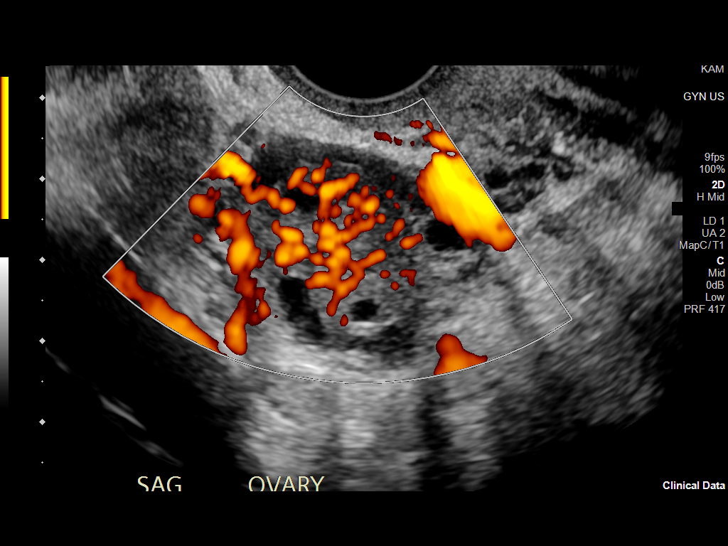
[im 94/103]
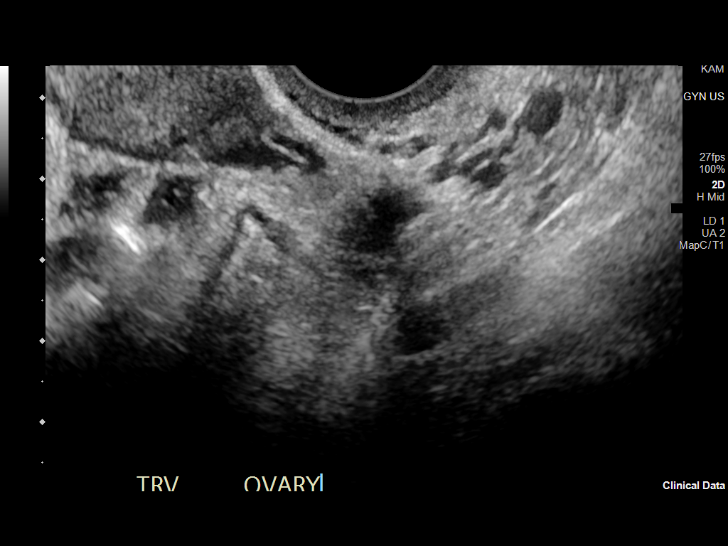
[im 103/103]
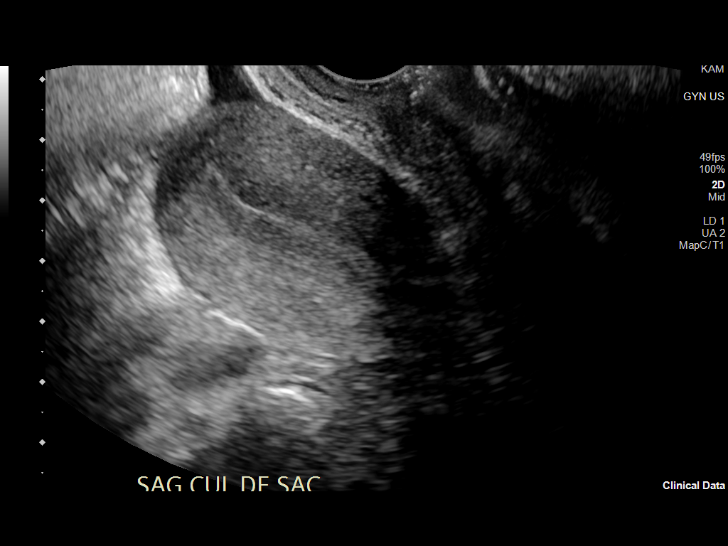

[14 of 25 positions shown; findings below may reference images not displayed]

FINDINGS: Uterus

Measurements: 7.1 x 3.0 x 4.1 cm. = volume: 45 mL. No fibroids or
other mass visualized.

Endometrium

Thickness: 13 mm.  No focal abnormality visualized.

Right ovary

Measurements: 4.2 x 2.3 x 2.3 cm. = volume: 1.4 mL. Normal
appearance/no adnexal mass.

Left ovary

Measurements: 3.7 x 2.6 x 2.0 cm. = volume: 10 mL. Normal
appearance/no adnexal mass.

Other findings

Mild to moderate free fluid is noted stable in appearance from the
prior exam likely physiologic in nature
IMPRESSION: Free fluid is noted within the pelvis but stable from the prior exam
and likely physiologic in nature. No acute abnormality is noted.
